# Patient Record
Sex: Female | Born: 1945 | Race: White | Hispanic: No | Marital: Married | Smoking: Never smoker
Health system: Southern US, Community
[De-identification: ages and names within clinical notes are randomized; demographics above are authoritative.]

## PROBLEM LIST (undated history)

## (undated) HISTORY — PX: SHOULDER SURGERY: SHX246

---

## 2020-11-21 ENCOUNTER — Emergency Department
Admission: EM | Admit: 2020-11-21 | Discharge: 2020-11-21 | Disposition: A | Discharge status: Other Acute Inpatient Hospital | Payer: Medicare HMO | Source: Home / Self Care | Attending: Family Medicine | Admitting: Family Medicine

## 2020-11-21 ENCOUNTER — Other Ambulatory Visit: Payer: Self-pay

## 2020-11-21 ENCOUNTER — Emergency Department (INDEPENDENT_AMBULATORY_CARE_PROVIDER_SITE_OTHER): Payer: Medicare HMO

## 2020-11-21 ENCOUNTER — Encounter: Payer: Self-pay | Admitting: Emergency Medicine

## 2020-11-21 DIAGNOSIS — M25511 Pain in right shoulder: Secondary | ICD-10-CM

## 2020-11-21 DIAGNOSIS — W19XXXA Unspecified fall, initial encounter: Secondary | ICD-10-CM

## 2020-11-21 DIAGNOSIS — S43004A Unspecified dislocation of right shoulder joint, initial encounter: Secondary | ICD-10-CM

## 2020-11-21 MED ORDER — IBUPROFEN 600 MG PO TABS
600.0000 mg | ORAL_TABLET | Freq: Once | ORAL | Status: AC
  Administered 2020-11-21: 600 mg via ORAL

## 2020-11-21 NOTE — ED Triage Notes (Signed)
Patient c/o right shoulder injury, pt fell coming out of [employer] this morning.  Patient having right arm/shoulder pain.  Patient has taken Excedrin this morning.

## 2020-11-21 NOTE — Discharge Instructions (Addendum)
GO TO ER °

## 2020-11-21 NOTE — ED Provider Notes (Signed)
[facility]    CSN: [id number] Arrival date & time: 11/21/20  0911      History   Chief Complaint Chief Complaint  Patient presents with  . Shoulder Injury    HPI Mary Scott is a 75 y.o. female.   HPI   Patient is in the area visiting family.  Old records are not available.  By her history she has hypertension, hyperlipidemia, diabetes, hypothyroidism, and glaucoma.  All of her medical problems are well controlled. This morning she went out to a restaurant and tripped on a curb.  Larey Seat towards her right side.  Injured her right shoulder.  Now has pain with any shoulder movement. Denies any pain with neck or back.  No pain in hips or legs.  Can ambulate well.  Denies hitting her head.  Fall was witnessed  History reviewed. No pertinent past medical history.    OB History   No obstetric history on file.      Home Medications    Prior to Admission medications   Medication Sig Start Date End Date Taking? Authorizing Provider  amLODIPine Besylate (NORVASC PO) Take by mouth.   Yes [provider]  Brimonidine Tartrate (ALPHAGAN P OP) Apply to eye.   Yes [provider]  Dextromethorphan-guaiFENesin (ROBITUSSIN DM PO) Take by mouth.   Yes [provider]  dorzolamide (TRUSOPT) 2 % ophthalmic solution  08/26/20  Yes [provider]  latanoprost (XALATAN) 0.005 % ophthalmic solution 1 drop at bedtime. 09/11/20  Yes [provider]  Levothyroxine Sodium (SYNTHROID PO) Take by mouth.   Yes [provider]  OMEPRAZOLE PO Take by mouth.   Yes [provider]  rosuvastatin (CRESTOR) 10 MG tablet Take 10 mg by mouth at bedtime. 10/26/20  Yes [provider]  SITagliptin-metFORMIN HCl (JANUMET PO) Take by mouth.   Yes [provider]  timolol (TIMOPTIC) 0.5 % ophthalmic solution  10/26/20  Yes [provider]    Family History No family history on file.  Social History Social  History   Tobacco Use  . Smoking status: Never Smoker  . Smokeless tobacco: Never Used  Substance Use Topics  . Alcohol use: Never  . Drug use: Never   Patient is retired.  Married.  Here with her husband  Allergies   Patient has no known allergies.   Review of Systems Review of Systems See HPI  Physical Exam Triage Vital Signs  No data found.  Updated Vital Signs BP (!) 185/93 (BP Location: Right Arm)   Pulse 71   SpO2 95%     Physical Exam Constitutional:      General: She is not in acute distress.    Appearance: She is well-developed.     Comments: Cradling her right arm close to body.  Obvious right shoulder deformity  HENT:     Head: Normocephalic and atraumatic.  Eyes:     Conjunctiva/sclera: Conjunctivae normal.     Pupils: Pupils are equal, round, and reactive to light.  Cardiovascular:     Rate and Rhythm: Normal rate.  Pulmonary:     Effort: Pulmonary effort is normal. No respiratory distress.  Abdominal:     General: There is no distension.     Palpations: Abdomen is soft.  Musculoskeletal:        General: Tenderness and deformity present. Normal range of motion.     Cervical back: Normal range of motion.  Skin:    General: Skin is warm and dry.  Comments: Abrasion on right elbow over proximal ulna, superficial  Neurological:     Mental Status: She is alert.  Psychiatric:        Behavior: Behavior normal.      UC Treatments / Results  Labs (all labs ordered are listed, but only abnormal results are displayed) Labs Reviewed - No data to display  EKG   Radiology DG Shoulder Right  Result Date: 11/21/2020 CLINICAL DATA:  Fall, right shoulder pain and deformity EXAM: RIGHT SHOULDER - 2+ VIEW COMPARISON:  None. FINDINGS: Anterior dislocation of the right humeral head at the glenohumeral joint. No evidence of acromioclavicular separation. No fracture. No focal osseous lesions. 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  IMPRESSION: Anterior right shoulder dislocation. No fracture. 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 Electronically Signed   By: Delbert Phenix M.D.   On: 11/21/2020 10:20    Procedures Procedures (including critical care time)  Medications Ordered in UC Medications  ibuprofen (ADVIL) tablet 600 mg (600 mg Oral Given 11/21/20 0959)    Initial Impression / Assessment and Plan / UC Course  I have reviewed the triage vital signs and the nursing notes.  Pertinent labs & imaging results that were available during my care of the patient were reviewed by me and considered in my medical decision making (see chart for details).      Final Clinical Impressions(s) / UC Diagnoses   Final diagnoses:  Pain in joint of right shoulder  Shoulder dislocation, right, initial encounter     Discharge Instructions     GO TO ER    ED Prescriptions    None     PDMP not reviewed this encounter.   Eustace Moore, MD 11/21/20 1057

## 2022-02-19 IMAGING — DX DG SHOULDER 2+V*R*
3 series · 3 of 3 positions shown · non-contrast
Comparison: None.

CLINICAL DATA: Fall, right shoulder pain and deformity

EXAM:
RIGHT SHOULDER - 2+ VIEW

[shoulder grashey]
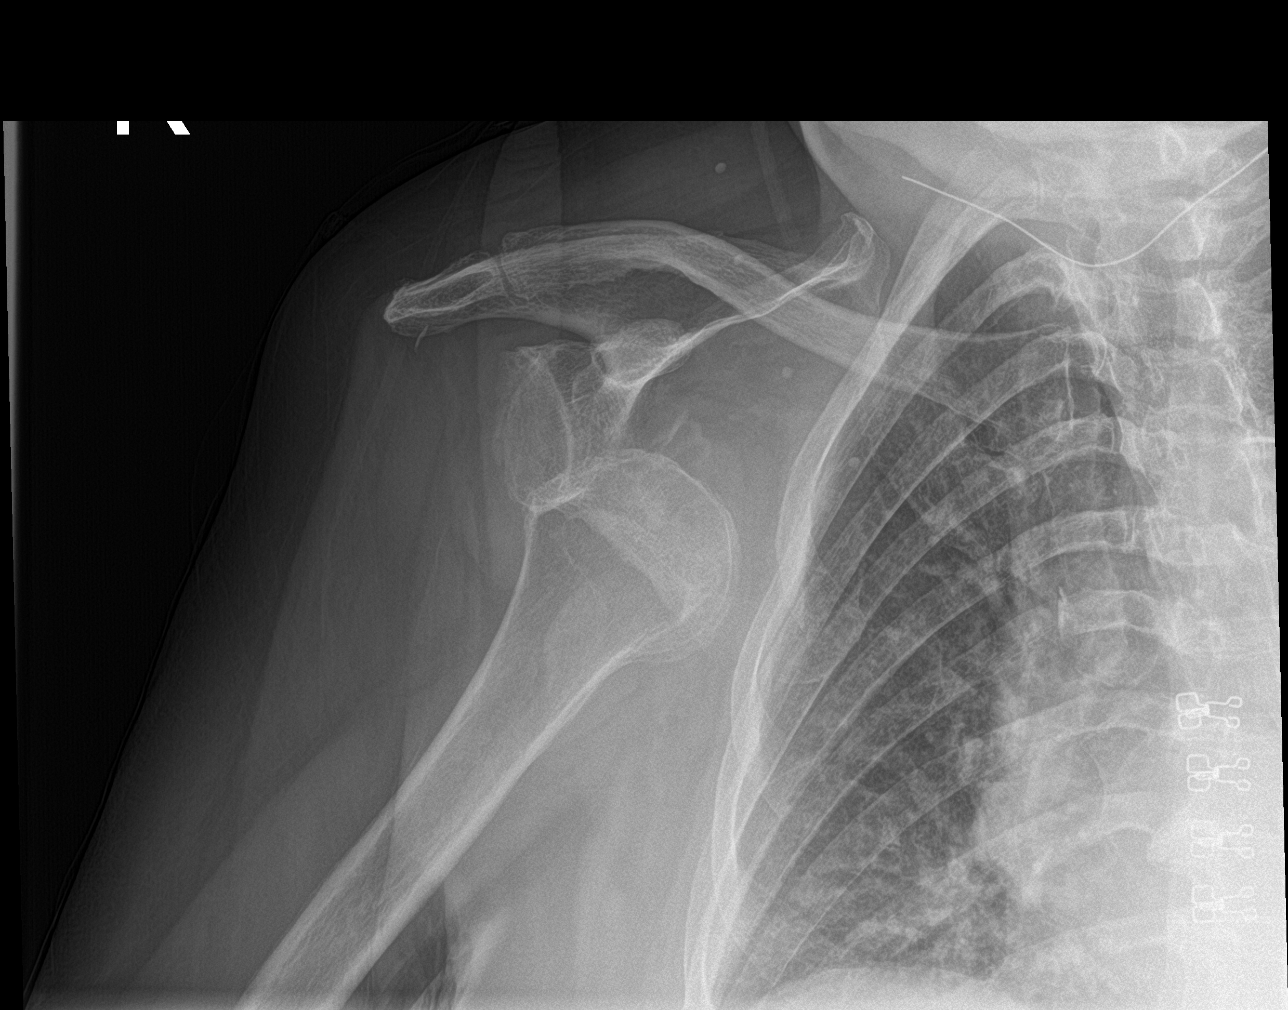

[shoulder y view]
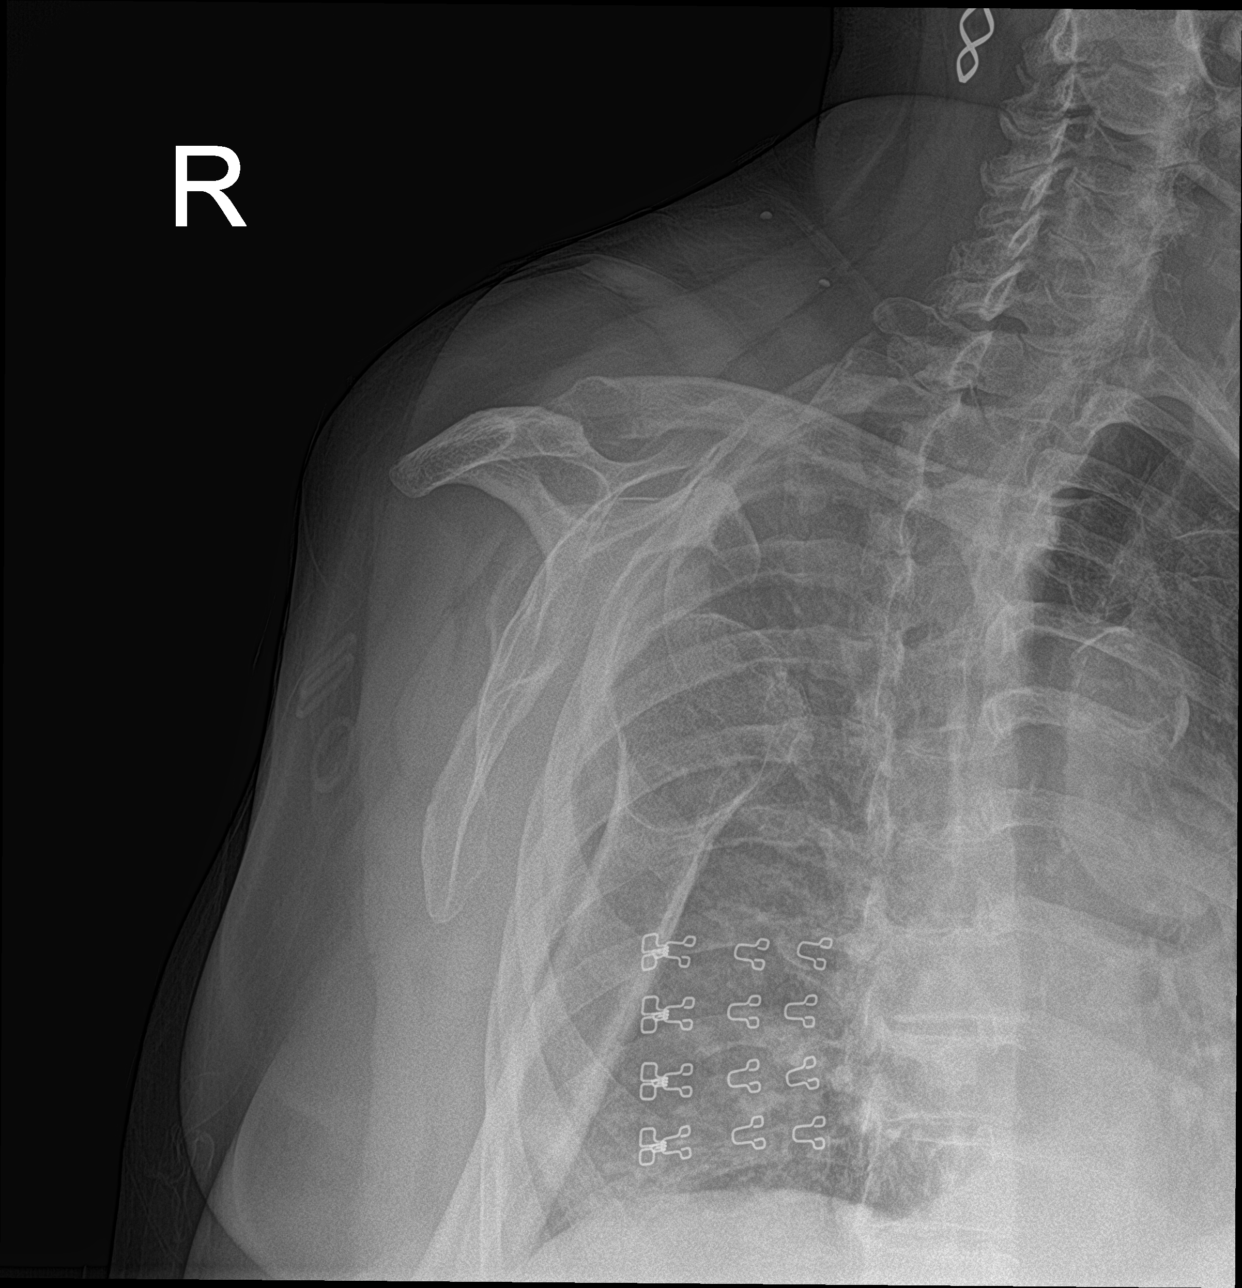

[shoulder tsy view]
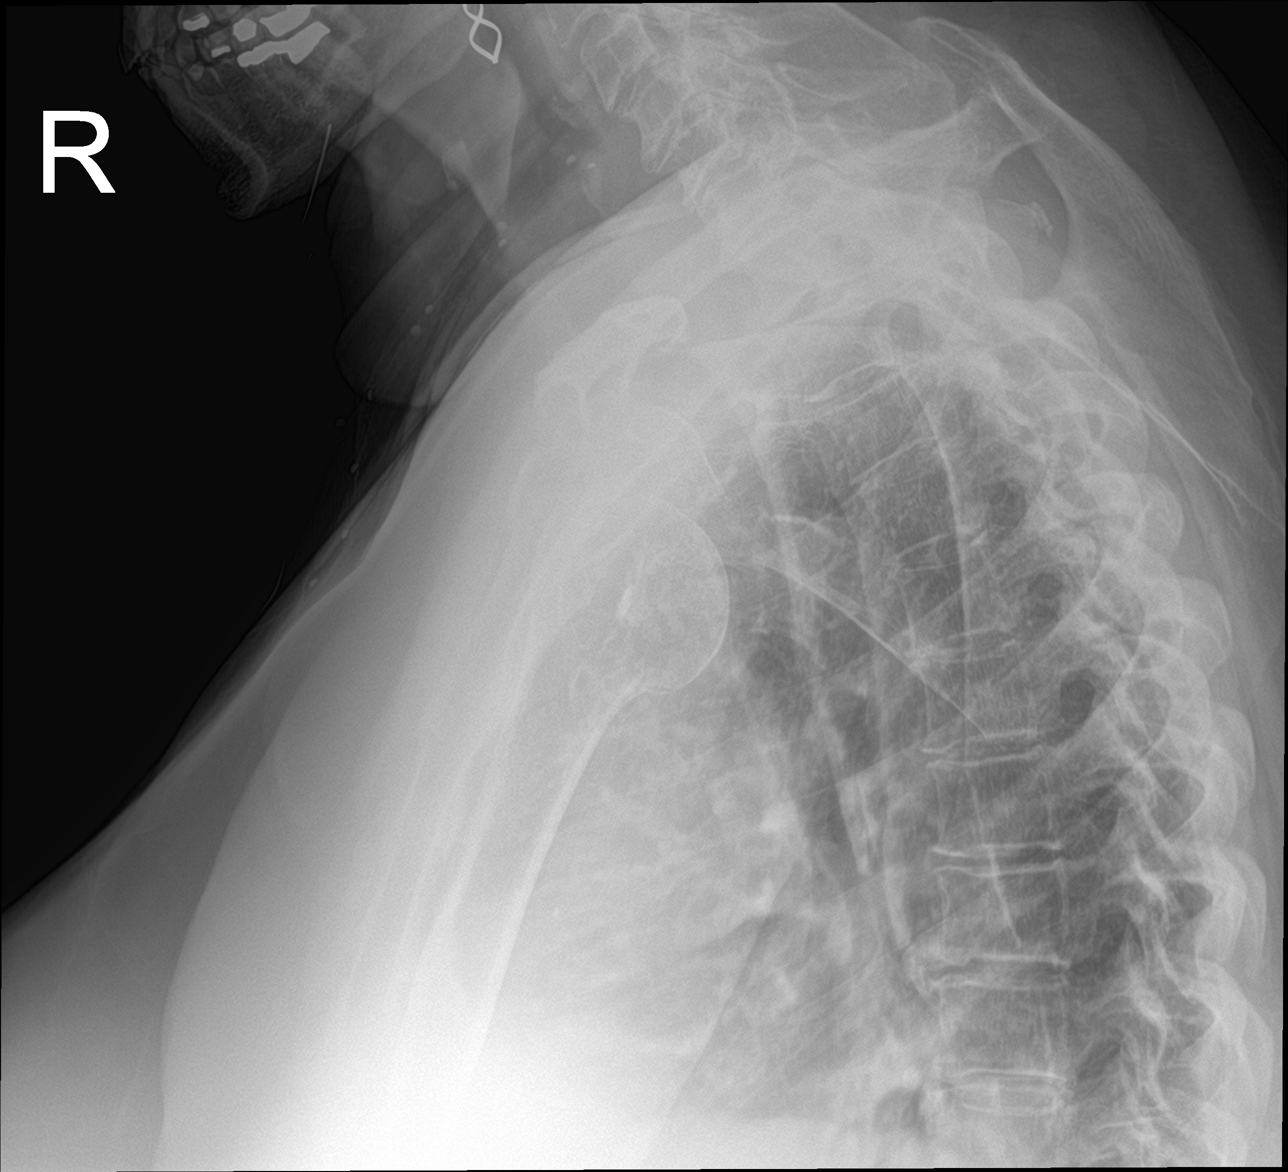

[3 of 3 positions shown; findings below may reference images not displayed]

FINDINGS: Anterior dislocation of the right humeral head at the glenohumeral
joint. No evidence of acromioclavicular separation. No fracture. No
focal osseous lesions. Mild AC joint and glenohumeral joint
osteoarthritis.
IMPRESSION: Anterior right shoulder dislocation. No fracture. Mild right AC
joint and glenohumeral joint osteoarthritis.
